# Patient Record
Sex: Male | Born: 1995 | Race: White | Hispanic: No | Marital: Single | Smoking: Never smoker
Health system: Southern US, Community
[De-identification: ages and names within clinical notes are randomized; demographics above are authoritative.]

## PROBLEM LIST (undated history)

## (undated) HISTORY — PX: HERNIA REPAIR: SHX51

---
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
## 2021-08-22 ENCOUNTER — Emergency Department
Admission: EM | Admit: 2021-08-22 | Discharge: 2021-08-22 | Disposition: A | Discharge status: Home / Self Care | Payer: Self-pay | Source: Home / Self Care | Attending: Family Medicine | Admitting: Family Medicine

## 2021-08-22 ENCOUNTER — Other Ambulatory Visit (HOSPITAL_COMMUNITY)
Admission: RE | Admit: 2021-08-22 | Discharge: 2021-08-22 | Disposition: A | Discharge status: Home / Self Care | Payer: Self-pay | Source: Ambulatory Visit | Attending: Family Medicine | Admitting: Family Medicine

## 2021-08-22 ENCOUNTER — Other Ambulatory Visit: Payer: Self-pay

## 2021-08-22 DIAGNOSIS — Z113 Encounter for screening for infections with a predominantly sexual mode of transmission: Secondary | ICD-10-CM | POA: Insufficient documentation

## 2021-08-22 DIAGNOSIS — R300 Dysuria: Secondary | ICD-10-CM

## 2021-08-22 LAB — POCT URINALYSIS DIP (MANUAL ENTRY)
Bilirubin, UA: NEGATIVE
Glucose, UA: NEGATIVE mg/dL
Ketones, POC UA: NEGATIVE mg/dL
Leukocytes, UA: NEGATIVE
Nitrite, UA: NEGATIVE
Spec Grav, UA: >=1.03 — AB (ref 1.010–1.025)
Urobilinogen, UA: 1 E.U./dL
pH, UA: 6 (ref 5.0–8.0)

## 2021-08-22 NOTE — ED Triage Notes (Addendum)
Pt here today for dysuria and urinary frequency x 1 week. No hx of UTIs. Denies any abd pain. Does also want full STD testing (including bloodwork)

## 2021-08-22 NOTE — ED Provider Notes (Signed)
[facility]    CSN: [id number] Arrival date & time: 08/22/21  1706      History   Chief Complaint   HPI Louis Morgan is a 25 y.o. male.   HPI  Patient states has had dysuria for a week.  Its improving over time.  He is trying to drink lots of water and fluids.  No fever or chills.  No rash.  No known exposure to STD but he has had unprotected sexual relations.  He states he has not been tested for STD for years.  He would like to have a full STD panel done "just in case". History reviewed. No pertinent past medical history.  Patient Active Problem List   Diagnosis Date Noted   SPRAIN/STRAIN, ANKLE NOS 11/08/2009   FOOT SPRAIN, LEFT 11/08/2009    Past Surgical History:  Procedure Laterality Date   HERNIA REPAIR         Home Medications    Prior to Admission medications   Not on File    Family History History reviewed. No pertinent family history.  Social History Social History   Tobacco Use   Smoking status: Never   Smokeless tobacco: Current  Substance Use Topics   Drug use: Yes    Comment: socially     Allergies   Patient has no known allergies.   Review of Systems Review of Systems See HPI  Physical Exam Triage Vital Signs ED Triage Vitals  Enc Vitals Group     BP 08/22/21 1716 (!) 151/96     Pulse Rate 08/22/21 1716 71     Resp 08/22/21 1716 17     Temp 08/22/21 1716 98.3 F (36.8 C)     Temp Source 08/22/21 1716 Oral     SpO2 08/22/21 1716 98 %     Weight --      Height --      Head Circumference --      Peak Flow --      Pain Score 08/22/21 1719 0     Pain Loc --      Pain Edu? --      Excl. in GC? --    No data found.  Updated Vital Signs BP (!) 151/96 (BP Location: Right Arm)   Pulse 71   Temp 98.3 F (36.8 C) (Oral)   Resp 17   SpO2 98%       Physical Exam Constitutional:      General: He is not in acute distress.    Appearance: He is well-developed and normal weight.  HENT:     Head:  Normocephalic and atraumatic.     Mouth/Throat:     Comments: Mask is in place Eyes:     Conjunctiva/sclera: Conjunctivae normal.     Pupils: Pupils are equal, round, and reactive to light.  Cardiovascular:     Rate and Rhythm: Normal rate.  Pulmonary:     Effort: Pulmonary effort is normal. No respiratory distress.  Abdominal:     General: There is no distension.     Palpations: Abdomen is soft.  Genitourinary:    Comments: Reviewed proper technique for STI self swab Musculoskeletal:        General: Normal range of motion.     Cervical back: Normal range of motion.  Skin:    General: Skin is warm and dry.  Neurological:     Mental Status: He is alert.  Psychiatric:        Mood and  Affect: Mood normal.        Behavior: Behavior normal.     UC Treatments / Results  Labs (all labs ordered are listed, but only abnormal results are displayed) Labs Reviewed  POCT URINALYSIS DIP (MANUAL ENTRY) - Abnormal; Notable for the following components:      Result Value   Spec Grav, UA >=1.030 (*)    Blood, UA trace-intact (*)    Protein Ur, POC trace (*)    All other components within normal limits  RPR  HIV ANTIBODY (ROUTINE TESTING W REFLEX)  CYTOLOGY, (ORAL, ANAL, URETHRAL) ANCILLARY ONLY    EKG   Radiology No results found.  Procedures Procedures (including critical care time)  Medications Ordered in UC Medications - No data to display  Initial Impression / Assessment and Plan / UC Course  I have reviewed the triage vital signs and the nursing notes.  Pertinent labs & imaging results that were available during my care of the patient were reviewed by me and considered in my medical decision making (see chart for details).     Reviewed causes for dysuria.  We will do STI testing.  Urinalysis not suggestive for cystitis.  We will treat based on his test results Final Clinical Impressions(s) / UC Diagnoses   Final diagnoses:  Screening for STDs (sexually transmitted  diseases)     Discharge Instructions       Drink plenty of water  check for results on MyChart   ED Prescriptions   None    PDMP not reviewed this encounter.   Eustace Moore, MD 08/22/21 [phone]

## 2021-08-22 NOTE — Discharge Instructions (Signed)
  Drink plenty of water  check for results on MyChart

## 2021-08-23 LAB — HIV ANTIBODY (ROUTINE TESTING W REFLEX): HIV 1&2 Ab, 4th Generation: NONREACTIVE

## 2021-08-23 LAB — RPR: RPR Ser Ql: NONREACTIVE

## 2021-08-27 LAB — CYTOLOGY, (ORAL, ANAL, URETHRAL) ANCILLARY ONLY
Chlamydia: POSITIVE — AB
Comment: NEGATIVE
Comment: NEGATIVE
Comment: NORMAL
Neisseria Gonorrhea: NEGATIVE
Trichomonas: NEGATIVE

## 2021-08-28 ENCOUNTER — Telehealth: Payer: Self-pay | Admitting: Emergency Medicine

## 2021-08-28 MED ORDER — DOXYCYCLINE HYCLATE 100 MG PO CAPS: 100.0000 mg | ORAL_CAPSULE | Freq: Two times a day (BID) | ORAL | 0 refills | Status: AC

## 2021-09-05 ENCOUNTER — Telehealth: Payer: Self-pay | Admitting: Emergency Medicine

## 2021-09-05 MED ORDER — DOXYCYCLINE HYCLATE 100 MG PO CAPS: 100.0000 mg | ORAL_CAPSULE | Freq: Two times a day (BID) | ORAL | 0 refills | Status: AC

## 2021-09-05 NOTE — Telephone Encounter (Signed)
noted 

## 2021-09-05 NOTE — Telephone Encounter (Signed)
Call from Louis Morgan regarding symptoms returning. Name & DOB confirmed. Pt states he completed all of the medication. RN to review chart & follow up with patient. Call back to patient who stated he had not had any type of sexual relations but had drunk a lot of alcohol while on the medicine since [place] the [place]. Med to be refilled by Dr Delton See. Pt verbalized an understanding that he should not engage in any sexual relations for the next 7 days & to not drink any type of alcohol.
